# Patient Record
Sex: Male | Born: 1995 | Race: Black or African American | Hispanic: No | Marital: Single
Health system: Southern US, Community
[De-identification: ages and names within clinical notes are randomized; demographics above are authoritative.]

---

## 2019-06-09 ENCOUNTER — Inpatient Hospital Stay (HOSPITAL_COMMUNITY)
Admission: EM | Admit: 2019-06-09 | Discharge: 2019-06-10 | DRG: 125 | Disposition: A | Discharge status: Home / Self Care | Payer: Self-pay | Attending: Surgery | Admitting: Surgery

## 2019-06-09 ENCOUNTER — Emergency Department (HOSPITAL_COMMUNITY): Payer: Self-pay

## 2019-06-09 ENCOUNTER — Encounter (HOSPITAL_COMMUNITY): Payer: Self-pay

## 2019-06-09 DIAGNOSIS — S0285XA Fracture of orbit, unspecified, initial encounter for closed fracture: Secondary | ICD-10-CM

## 2019-06-09 DIAGNOSIS — J690 Pneumonitis due to inhalation of food and vomit: Secondary | ICD-10-CM

## 2019-06-09 DIAGNOSIS — Z20828 Contact with and (suspected) exposure to other viral communicable diseases: Secondary | ICD-10-CM | POA: Diagnosis present

## 2019-06-09 DIAGNOSIS — F0781 Postconcussional syndrome: Secondary | ICD-10-CM

## 2019-06-09 DIAGNOSIS — T17908A Unspecified foreign body in respiratory tract, part unspecified causing other injury, initial encounter: Secondary | ICD-10-CM

## 2019-06-09 DIAGNOSIS — S069X1A Unspecified intracranial injury with loss of consciousness of 30 minutes or less, initial encounter: Secondary | ICD-10-CM

## 2019-06-09 DIAGNOSIS — S0232XA Fracture of orbital floor, left side, initial encounter for closed fracture: Principal | ICD-10-CM | POA: Diagnosis present

## 2019-06-09 DIAGNOSIS — R40241 Glasgow coma scale score 13-15, unspecified time: Secondary | ICD-10-CM | POA: Diagnosis present

## 2019-06-09 LAB — RAPID URINE DRUG SCREEN, HOSP PERFORMED
Amphetamines: NOT DETECTED
Barbiturates: NOT DETECTED
Benzodiazepines: NOT DETECTED
Cocaine: NOT DETECTED
Opiates: NOT DETECTED
Tetrahydrocannabinol: NOT DETECTED

## 2019-06-09 LAB — CBC
HCT: 45.6 % (ref 39.0–52.0)
Hemoglobin: 14.9 g/dL (ref 13.0–17.0)
MCH: 27.6 pg (ref 26.0–34.0)
MCHC: 32.7 g/dL (ref 30.0–36.0)
MCV: 84.4 fL (ref 80.0–100.0)
Platelets: 189 10*3/uL (ref 150–400)
RBC: 5.4 MIL/uL (ref 4.22–5.81)
RDW: 13.6 % (ref 11.5–15.5)
WBC: 6.9 10*3/uL (ref 4.0–10.5)
nRBC: 0 % (ref 0.0–0.2)

## 2019-06-09 LAB — COMPREHENSIVE METABOLIC PANEL
ALT: 14 U/L (ref 0–44)
AST: 22 U/L (ref 15–41)
Albumin: 4.6 g/dL (ref 3.5–5.0)
Alkaline Phosphatase: 39 U/L (ref 38–126)
Anion gap: 11 (ref 5–15)
BUN: 11 mg/dL (ref 6–20)
CO2: 25 mmol/L (ref 22–32)
Calcium: 9.5 mg/dL (ref 8.9–10.3)
Chloride: 103 mmol/L (ref 98–111)
Creatinine, Ser: 1.33 mg/dL — ABNORMAL HIGH (ref 0.61–1.24)
GFR calc Af Amer: >60 mL/min (ref >60)
GFR calc non Af Amer: >60 mL/min (ref >60)
Glucose, Bld: 113 mg/dL — ABNORMAL HIGH (ref 70–99)
Potassium: 3.4 mmol/L — ABNORMAL LOW (ref 3.5–5.1)
Sodium: 139 mmol/L (ref 135–145)
Total Bilirubin: 1.2 mg/dL (ref 0.3–1.2)
Total Protein: 7.7 g/dL (ref 6.5–8.1)

## 2019-06-09 LAB — URINALYSIS, ROUTINE W REFLEX MICROSCOPIC
Bilirubin Urine: NEGATIVE
Glucose, UA: NEGATIVE mg/dL
Ketones, ur: NEGATIVE mg/dL
Leukocytes,Ua: NEGATIVE
Nitrite: NEGATIVE
Protein, ur: NEGATIVE mg/dL
RBC / HPF: >50 RBC/hpf — ABNORMAL HIGH (ref 0–5)
Specific Gravity, Urine: 1.034 — ABNORMAL HIGH (ref 1.005–1.030)
pH: 7 (ref 5.0–8.0)

## 2019-06-09 LAB — CBG MONITORING, ED: Glucose-Capillary: 100 mg/dL — ABNORMAL HIGH (ref 70–99)

## 2019-06-09 MED ORDER — POTASSIUM CHLORIDE IN NACL 20-0.9 MEQ/L-% IV SOLN
INTRAVENOUS | Status: DC
  Administered 2019-06-10: via INTRAVENOUS
  Filled 2019-06-09: qty 1000

## 2019-06-09 MED ORDER — SODIUM CHLORIDE 0.9 % IV SOLN
3.0000 g | Freq: Once | INTRAVENOUS | Status: AC
  Administered 2019-06-09: 3 g via INTRAVENOUS
  Filled 2019-06-09: qty 8

## 2019-06-09 MED ORDER — ONDANSETRON HCL 4 MG/2ML IJ SOLN
INTRAMUSCULAR | Status: AC
  Administered 2019-06-09: 17:00:00 4 mg via INTRAVENOUS
  Filled 2019-06-09: qty 2

## 2019-06-09 MED ORDER — CHLORHEXIDINE GLUCONATE CLOTH 2 % EX PADS: 6.0000 | MEDICATED_PAD | Freq: Every day | CUTANEOUS | Status: DC

## 2019-06-09 MED ORDER — ONDANSETRON HCL 4 MG/2ML IJ SOLN
4.0000 mg | Freq: Once | INTRAMUSCULAR | Status: AC
  Administered 2019-06-09: 17:00:00 4 mg via INTRAVENOUS

## 2019-06-09 MED ORDER — LORAZEPAM 2 MG/ML IJ SOLN
1.0000 mg | Freq: Once | INTRAMUSCULAR | Status: AC
  Administered 2019-06-09: 17:00:00 1 mg via INTRAVENOUS

## 2019-06-09 MED ORDER — ENOXAPARIN SODIUM 40 MG/0.4ML ~~LOC~~ SOLN
40.0000 mg | SUBCUTANEOUS | Status: DC
  Administered 2019-06-10: 40 mg via SUBCUTANEOUS
  Filled 2019-06-09: qty 0.4

## 2019-06-09 MED ORDER — SODIUM CHLORIDE (PF) 0.9 % IJ SOLN
INTRAMUSCULAR | Status: AC
  Filled 2019-06-09: qty 50

## 2019-06-09 MED ORDER — ONDANSETRON 4 MG PO TBDP: 4.0000 mg | ORAL_TABLET | Freq: Four times a day (QID) | ORAL | Status: DC | PRN

## 2019-06-09 MED ORDER — LORAZEPAM 2 MG/ML IJ SOLN
INTRAMUSCULAR | Status: AC
  Administered 2019-06-09: 1 mg via INTRAVENOUS
  Filled 2019-06-09: qty 1

## 2019-06-09 MED ORDER — TETRACAINE HCL 0.5 % OP SOLN
2.0000 [drp] | Freq: Once | OPHTHALMIC | Status: AC
  Administered 2019-06-09: 2 [drp] via OPHTHALMIC
  Filled 2019-06-09: qty 4

## 2019-06-09 MED ORDER — DEXAMETHASONE SODIUM PHOSPHATE 10 MG/ML IJ SOLN
10.0000 mg | Freq: Once | INTRAMUSCULAR | Status: AC
  Administered 2019-06-09: 10 mg via INTRAVENOUS
  Filled 2019-06-09: qty 1

## 2019-06-09 MED ORDER — IOHEXOL 300 MG/ML  SOLN
100.0000 mL | Freq: Once | INTRAMUSCULAR | Status: AC | PRN
  Administered 2019-06-09: 100 mL via INTRAVENOUS

## 2019-06-09 MED ORDER — SODIUM CHLORIDE 0.9 % IV BOLUS
500.0000 mL | Freq: Once | INTRAVENOUS | Status: AC
  Administered 2019-06-09: 16:00:00 500 mL via INTRAVENOUS

## 2019-06-09 MED ORDER — SODIUM CHLORIDE 0.9 % IV SOLN
3.0000 g | Freq: Four times a day (QID) | INTRAVENOUS | Status: DC
  Administered 2019-06-10 (×3): 3 g via INTRAVENOUS
  Filled 2019-06-09: qty 8
  Filled 2019-06-09 (×2): qty 3
  Filled 2019-06-09 (×2): qty 8
  Filled 2019-06-09: qty 3
  Filled 2019-06-09: qty 8

## 2019-06-09 MED ORDER — ONDANSETRON HCL 4 MG/2ML IJ SOLN: 4.0000 mg | Freq: Four times a day (QID) | INTRAMUSCULAR | Status: DC | PRN

## 2019-06-09 MED ORDER — FLUORESCEIN SODIUM 1 MG OP STRP
1.0000 | ORAL_STRIP | Freq: Once | OPHTHALMIC | Status: AC
  Administered 2019-06-09: 1 via OPHTHALMIC
  Filled 2019-06-09: qty 1

## 2019-06-09 MED ORDER — MORPHINE SULFATE (PF) 4 MG/ML IV SOLN: 4.0000 mg | INTRAVENOUS | Status: DC | PRN

## 2019-06-09 MED ORDER — MORPHINE SULFATE (PF) 2 MG/ML IV SOLN: 2.0000 mg | INTRAVENOUS | Status: DC | PRN

## 2019-06-09 MED ORDER — MORPHINE SULFATE (PF) 2 MG/ML IV SOLN
1.0000 mg | INTRAVENOUS | Status: DC | PRN
  Administered 2019-06-10: 1 mg via INTRAVENOUS
  Filled 2019-06-09: qty 1

## 2019-06-09 MED ORDER — SODIUM CHLORIDE 0.9% FLUSH
3.0000 mL | Freq: Once | INTRAVENOUS | Status: AC
  Administered 2019-06-09: 3 mL via INTRAVENOUS

## 2019-06-09 NOTE — ED Notes (Signed)
Report to [facility] 

## 2019-06-09 NOTE — ED Notes (Signed)
MD accompanied pt back at bedside with pt. Pt was placed in c-collar while in CT scanner. Pt remains unresponsive at this time.

## 2019-06-09 NOTE — ED Notes (Signed)
[facility] has came to pick patient up.

## 2019-06-09 NOTE — ED Notes (Signed)
Notified [facility] for transporation. Will inform patient and mother of the update.

## 2019-06-09 NOTE — ED Notes (Signed)
Pt responsive to painful stimuli as in and out cath performed. Pt did state "ah shit." Pt noted to be incontinent of urine post seizure.
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
## 2019-06-09 NOTE — ED Notes (Signed)
Gave report to Abby, RN for [phone].
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
## 2019-06-09 NOTE — ED Notes (Addendum)
Seizure like activity noted with RN in CT scanner, IV ativan given.
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
## 2019-06-09 NOTE — ED Notes (Signed)
Pt speaking to RN at this time, can state name and DOB, is aware that he is in [place] but unsure of location.
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
## 2019-06-09 NOTE — ED Notes (Signed)
Pt to CT at this time on monitor

## 2019-06-09 NOTE — ED Notes (Signed)
Pt remains non-verbal at this time, will make eye contact and follow commands. No seizure like activity noted since CT scanner.

## 2019-06-09 NOTE — ED Notes (Addendum)
In and out cath performed by but not enough urine collected to sent to lab. Condom cath placed in attempt to recollect urine. Lab will be notified.
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
## 2019-06-09 NOTE — ED Notes (Signed)
Sending down urine for the already clicked off urine and drug screen. Patient urinated about 400-567mL. Patients mother at bedside.

## 2019-06-09 NOTE — ED Notes (Signed)
Patients mother, Joycelyn Schmid, [phone]

## 2019-06-09 NOTE — ED Notes (Signed)
[employer] at bedside to attempt to speak to patient.

## 2019-06-09 NOTE — ED Notes (Signed)
Patient noted to have seizure like activity in CT. MD at bedside. Patient turned onto side and suction set up.
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
## 2019-06-10 ENCOUNTER — Inpatient Hospital Stay (HOSPITAL_COMMUNITY): Payer: Self-pay

## 2019-06-10 LAB — BASIC METABOLIC PANEL
Anion gap: 9 (ref 5–15)
BUN: 10 mg/dL (ref 6–20)
CO2: 26 mmol/L (ref 22–32)
Calcium: 9.6 mg/dL (ref 8.9–10.3)
Chloride: 103 mmol/L (ref 98–111)
Creatinine, Ser: 1.3 mg/dL — ABNORMAL HIGH (ref 0.61–1.24)
GFR calc Af Amer: >60 mL/min (ref >60)
GFR calc non Af Amer: >60 mL/min (ref >60)
Glucose, Bld: 131 mg/dL — ABNORMAL HIGH (ref 70–99)
Potassium: 4 mmol/L (ref 3.5–5.1)
Sodium: 138 mmol/L (ref 135–145)

## 2019-06-10 LAB — CBC
HCT: 41 % (ref 39.0–52.0)
Hemoglobin: 13.6 g/dL (ref 13.0–17.0)
MCH: 27.5 pg (ref 26.0–34.0)
MCHC: 33.2 g/dL (ref 30.0–36.0)
MCV: 82.8 fL (ref 80.0–100.0)
Platelets: 187 10*3/uL (ref 150–400)
RBC: 4.95 MIL/uL (ref 4.22–5.81)
RDW: 13.3 % (ref 11.5–15.5)
WBC: 16.3 10*3/uL — ABNORMAL HIGH (ref 4.0–10.5)
nRBC: 0 % (ref 0.0–0.2)

## 2019-06-10 LAB — MRSA PCR SCREENING: MRSA by PCR: NEGATIVE

## 2019-06-10 LAB — SARS CORONAVIRUS 2 (TAT 6-24 HRS): SARS Coronavirus 2: NEGATIVE

## 2019-06-10 MED ORDER — AMOXICILLIN-POT CLAVULANATE 875-125 MG PO TABS: 1.0000 | ORAL_TABLET | Freq: Two times a day (BID) | ORAL | Status: DC

## 2019-06-10 MED ORDER — AMOXICILLIN-POT CLAVULANATE 875-125 MG PO TABS: 1.0000 | ORAL_TABLET | Freq: Two times a day (BID) | ORAL | 0 refills | Status: AC

## 2019-06-10 MED ORDER — ACETAMINOPHEN 325 MG PO TABS: 650.0000 mg | ORAL_TABLET | Freq: Four times a day (QID) | ORAL | Status: DC | PRN

## 2019-06-10 MED ORDER — ONDANSETRON 4 MG PO TBDP: 4.0000 mg | ORAL_TABLET | Freq: Four times a day (QID) | ORAL | 0 refills | Status: AC | PRN

## 2019-06-10 MED ORDER — METOPROLOL TARTRATE 5 MG/5ML IV SOLN: 5.0000 mg | Freq: Once | INTRAVENOUS | Status: DC

## 2019-06-10 MED ORDER — TRAMADOL HCL 50 MG PO TABS: 50.0000 mg | ORAL_TABLET | Freq: Four times a day (QID) | ORAL | 0 refills | Status: DC | PRN

## 2019-06-10 MED ORDER — TRAMADOL HCL 50 MG PO TABS: 50.0000 mg | ORAL_TABLET | Freq: Four times a day (QID) | ORAL | 0 refills | Status: AC | PRN

## 2019-06-10 MED ORDER — ACETAMINOPHEN 325 MG PO TABS: 650.0000 mg | ORAL_TABLET | Freq: Four times a day (QID) | ORAL | Status: AC | PRN
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
## 2019-06-10 NOTE — Discharge Instructions (Signed)
Instructions for facial fractures: 1. Take antibiotic (Augmentin) as prescribed for 7 days 2. Sinus precautions to include no smoking, no nose blowing, open mouth sneezes. 3. Follow up with Dr. Mancel Parsons in 7-10 days for re-evaluation.     1. PAIN CONTROL:  1. Pain is best controlled by a usual combination of three different methods TOGETHER:  i. Ice/Heat ii. Over the counter pain medication iii. Prescription pain medication 2. You may experience some swelling and bruising. Recommend using ice packs (30-60 minutes up to 6 times a day). Swelling and bruising can take several weeks to resolve.  3. It is helpful to take an over-the-counter pain medication regularly for the first few weeks. Choose one of the following that works best for you:  i. Naproxen (Aleve, etc) Two 220mg  tabs twice a day ii. Ibuprofen (Advil, etc) Three 200mg  tabs four times a day (every meal & bedtime) iii. Acetaminophen (Tylenol, etc) 500-650mg  four times a day (every meal & bedtime) 4. A prescription for pain medication (such as oxycodone, hydrocodone, etc) may be given to you upon discharge. Take your pain medication as prescribed.  i. If you are having problems/concerns with the prescription medicine (does not control pain, nausea, vomiting, rash, itching, etc), please call to see if we need to switch you to a different pain medicine that will work better for you and/or control your side effect better. 1. Avoid getting constipated. When taking pain medications, it is common to experience some constipation. Increasing fluid intake and taking a fiber supplement (such as Metamucil, Citrucel, FiberCon, MiraLax, etc) 1-2 times a day regularly will usually help prevent this problem from occurring. A mild laxative (prune juice, Milk of Magnesia, MiraLax, etc) should be taken according to package directions if there are no bowel movements after 48 hours.
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
## 2019-06-10 NOTE — Progress Notes (Signed)
Patient discharged to home with instructions. 
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
## 2019-06-11 LAB — HIV ANTIBODY (ROUTINE TESTING W REFLEX): HIV Screen 4th Generation wRfx: NONREACTIVE
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
